# Patient Record
Sex: Male | Born: 2000 | Race: White | Hispanic: No | Marital: Single | Smoking: Never smoker
Health system: Southern US, Community
[De-identification: ages and names within clinical notes are randomized; demographics above are authoritative.]

## PROBLEM LIST (undated history)

## (undated) DIAGNOSIS — T783XXA Angioneurotic edema, initial encounter: Secondary | ICD-10-CM

## (undated) DIAGNOSIS — L309 Dermatitis, unspecified: Secondary | ICD-10-CM

## (undated) HISTORY — DX: Angioneurotic edema, initial encounter: T78.3XXA

## (undated) HISTORY — PX: ADENOIDECTOMY: SUR15

## (undated) HISTORY — PX: TONSILLECTOMY: SUR1361

## (undated) HISTORY — DX: Dermatitis, unspecified: L30.9

## (undated) HISTORY — PX: APPENDECTOMY: SHX54

---
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
## 2021-08-29 ENCOUNTER — Encounter (HOSPITAL_COMMUNITY): Payer: Self-pay

## 2021-08-29 ENCOUNTER — Other Ambulatory Visit: Payer: Self-pay

## 2021-08-29 ENCOUNTER — Ambulatory Visit (HOSPITAL_COMMUNITY)
Admission: EM | Admit: 2021-08-29 | Discharge: 2021-08-29 | Disposition: A | Discharge status: Home / Self Care | Payer: Medicaid Other | Attending: Internal Medicine | Admitting: Internal Medicine

## 2021-08-29 DIAGNOSIS — L219 Seborrheic dermatitis, unspecified: Secondary | ICD-10-CM | POA: Diagnosis not present

## 2021-08-29 MED ORDER — KETOCONAZOLE 2 % EX SHAM: 1.0000 "application " | MEDICATED_SHAMPOO | CUTANEOUS | 0 refills | Status: DC

## 2021-08-29 NOTE — ED Triage Notes (Signed)
Pt c/o small bumps off and on to scalp for a lyear that itches and stings. States had one to lt cheek that he popped and it drained. States his PCP told him to come here for an abx.

## 2021-08-29 NOTE — Discharge Instructions (Addendum)
Please use antifungal shampoo twice a week If symptoms worsen please follow-up with your pediatrician for further evaluation Are no signs of bacterial skin infection.

## 2021-08-29 NOTE — ED Provider Notes (Signed)
[name]    CSN: [id number] Arrival date & time: 08/29/21  1639      History   Chief Complaint No chief complaint on file.   HPI Lonnie West is a 21 y.o. male comes to urgent care with 1 year history of itchy scalp.  Patient says his symptoms have been recurrent in nature over the past year.  Scalp is itchy with intermittent nodular lesion.  He denies any discharge.  He has tried over-the-counter hair shampoo with no improvement in symptoms.  Patient used steroid cream and x-ray with no improvement in symptoms as well.  He was directed to come to the urgent care for further evaluation.  No fever or chills.  No erythema.  Patient has noticed increased desquamation of the scalp skin with no loss of hair.   HPI  Past Medical History:  Diagnosis Date   Angio-edema    Eczema     Patient Active Problem List   Diagnosis Date Noted   Food allergy 10/03/2018   Seasonal and perennial allergic rhinitis 10/03/2018   Allergic conjunctivitis 10/03/2018   Atopic dermatitis 10/03/2018   Left hamstring muscle strain 10/25/2017    Past Surgical History:  Procedure Laterality Date   ADENOIDECTOMY     APPENDECTOMY     TONSILLECTOMY         Home Medications    Prior to Admission medications   Medication Sig Start Date End Date Taking? Authorizing Provider  ketoconazole (NIZORAL) 2 % shampoo Apply 1 application topically 2 (two) times a week. 09/01/21  Yes Ark Agrusa, Myrene Galas, MD  azelastine (ASTELIN) 0.1 % nasal spray Place 2 sprays into both nostrils 2 (two) times daily as needed for rhinitis. 10/03/18   Bobbitt, Sedalia Muta, MD  Crisaborole (EUCRISA) 2 % OINT Apply 1 application topically 2 (two) times daily as needed. 10/03/18   Bobbitt, Sedalia Muta, MD  ibuprofen (ADVIL,MOTRIN) 600 MG tablet Take 1 tablet (600 mg total) by mouth every 6 (six) hours as needed. 10/25/18   Wieters, Hallie C, PA-C  levocetirizine (XYZAL) 5 MG tablet Take 1 tablet (5 mg total) by mouth every  evening. 10/03/18   Bobbitt, Sedalia Muta, MD  triamcinolone ointment (KENALOG) 0.1 % Apply 1 application topically 2 (two) times daily as needed. 10/03/18   Bobbitt, Sedalia Muta, MD    Family History Family History  Problem Relation Age of Onset   Healthy Mother    Healthy Father     Social History Social History   Tobacco Use   Smoking status: Never   Smokeless tobacco: Never  Vaping Use   Vaping Use: Never used  Substance Use Topics   Alcohol use: No   Drug use: No     Allergies   Patient has no known allergies.   Review of Systems Review of Systems  Skin:  Positive for rash. Negative for color change and wound.  Neurological: Negative.  Negative for headaches.    Physical Exam Triage Vital Signs ED Triage Vitals  Enc Vitals Group     BP 08/29/21 1710 105/72     Pulse Rate 08/29/21 1710 80     Resp 08/29/21 1710 18     Temp 08/29/21 1710 97.9 F (36.6 C)     Temp Source 08/29/21 1710 Oral     SpO2 08/29/21 1710 98 %     Weight --      Height --      Head Circumference --      Peak  Flow --      Pain Score 08/29/21 1711 0     Pain Loc --      Pain Edu? --      Excl. in [place]? --    No data found.  Updated Vital Signs BP 105/72 (BP Location: Left Arm)    Pulse 80    Temp 97.9 F (36.6 C) (Oral)    Resp 18    SpO2 98%   Visual Acuity Right Eye Distance:   Left Eye Distance:   Bilateral Distance:    Right Eye Near:   Left Eye Near:    Bilateral Near:     Physical Exam Vitals and nursing note reviewed.  Constitutional:      General: He is not in acute distress.    Appearance: He is not ill-appearing.  Skin:    Comments: Patient has long hair.  Desquamation of scalp skin noted.  No erythema.  No discharge.  No palpable nodules or masses.  Neurological:     Mental Status: He is alert.     UC Treatments / Results  Labs (all labs ordered are listed, but only abnormal results are displayed) Labs Reviewed - No data to  display  EKG   Radiology No results found.  Procedures Procedures (including critical care time)  Medications Ordered in UC Medications - No data to display  Initial Impression / Assessment and Plan / UC Course  I have reviewed the triage vital signs and the nursing notes.  Pertinent labs & imaging results that were available during my care of the patient were reviewed by me and considered in my medical decision making (see chart for details).     Seborrheic dermatitis of the scalp: Ketoconazole shampoo twice weekly If patient has persistent symptoms or no improvement in his symptoms he is advised to follow-up with his PCP for further recommendations. Final Clinical Impressions(s) / UC Diagnoses   Final diagnoses:  Seborrheic dermatitis of scalp     Discharge Instructions      Please use antifungal shampoo twice a week If symptoms worsen please follow-up with your pediatrician for further evaluation Are no signs of bacterial skin infection.   ED Prescriptions     Medication Sig Dispense Auth. Provider   ketoconazole (NIZORAL) 2 % shampoo Apply 1 application topically 2 (two) times a week. 120 mL Sherlon Nied, Myrene Galas, MD      PDMP not reviewed this encounter.   Chase Picket, MD 08/29/21 [phone]

## 2022-03-19 ENCOUNTER — Encounter (HOSPITAL_COMMUNITY): Payer: Self-pay | Admitting: Emergency Medicine

## 2022-03-19 ENCOUNTER — Other Ambulatory Visit: Payer: Self-pay

## 2022-03-19 ENCOUNTER — Ambulatory Visit (HOSPITAL_COMMUNITY)
Admission: EM | Admit: 2022-03-19 | Discharge: 2022-03-19 | Disposition: A | Discharge status: Home / Self Care | Payer: Medicaid Other | Attending: Emergency Medicine | Admitting: Emergency Medicine

## 2022-03-19 DIAGNOSIS — W57XXXA Bitten or stung by nonvenomous insect and other nonvenomous arthropods, initial encounter: Secondary | ICD-10-CM

## 2022-03-19 DIAGNOSIS — S30860A Insect bite (nonvenomous) of lower back and pelvis, initial encounter: Secondary | ICD-10-CM | POA: Diagnosis not present

## 2022-03-19 DIAGNOSIS — D229 Melanocytic nevi, unspecified: Secondary | ICD-10-CM | POA: Diagnosis not present

## 2022-03-19 DIAGNOSIS — L0201 Cutaneous abscess of face: Secondary | ICD-10-CM

## 2022-03-19 MED ORDER — KETOCONAZOLE 2 % EX SHAM: 1.0000 | MEDICATED_SHAMPOO | CUTANEOUS | 0 refills | Status: AC

## 2022-03-19 MED ORDER — DOXYCYCLINE HYCLATE 100 MG PO CAPS: 100.0000 mg | ORAL_CAPSULE | Freq: Two times a day (BID) | ORAL | 0 refills | Status: AC

## 2022-03-19 MED ORDER — TRIAMCINOLONE ACETONIDE 0.1 % EX OINT: 1.0000 | TOPICAL_OINTMENT | Freq: Two times a day (BID) | CUTANEOUS | 3 refills | Status: AC | PRN

## 2022-03-19 NOTE — Discharge Instructions (Addendum)
Take the medication twice daily for 5 days. I recommend to take with food.  I have refilled your Kenalog cream and ketoconazole shampoo.  If you have itching on the insect bite, you can apply hydrocortisone shampoo.  I recommend follow up with primary care regarding the right hand mole, and possible referral to dermatology if needed.

## 2022-03-19 NOTE — ED Provider Notes (Signed)
[place]    CSN: [id number] Arrival date & time: 03/19/22  1411      History   Chief Complaint Chief Complaint  Patient presents with   Cyst    HPI Lonnie West is a 21 y.o. male.  Presents with left jaw abscess.  Reports it had been very swollen, red, tender but has been getting better over the last few days.  History of abscess in the same place about 3 months ago.  Denies any drainage, no fever or chills.  Does not have any sore throat or mouth pain.  No dental pain.  Additionally reports insect bite to the right lower back a few days ago. Has been getting better, not itchy, no drainage.  No pain.  He has a mole on his right dorsal hand.  Reports has had this since birth but feels like it has gotten a little bit bigger recently.  Denies any change in color, symmetry or shape.  He lives in [place] and will be returning home in 3 days.  Does not have a primary care provider there.  Past Medical History:  Diagnosis Date   Angio-edema    Eczema     Patient Active Problem List   Diagnosis Date Noted   Food allergy 10/03/2018   Seasonal and perennial allergic rhinitis 10/03/2018   Allergic conjunctivitis 10/03/2018   Atopic dermatitis 10/03/2018   Left hamstring muscle strain 10/25/2017    Past Surgical History:  Procedure Laterality Date   ADENOIDECTOMY     APPENDECTOMY     TONSILLECTOMY         Home Medications    Prior to Admission medications   Medication Sig Start Date End Date Taking? Authorizing Provider  doxycycline (VIBRAMYCIN) 100 MG capsule Take 1 capsule (100 mg total) by mouth 2 (two) times daily for 5 days. 03/19/22 03/24/22 Yes Devanee Pomplun, Wells Guiles, PA-C  ketoconazole (NIZORAL) 2 % shampoo Apply 1 Application topically 2 (two) times a week. 03/19/22   Anastasya Jewell, Wells Guiles, PA-C  triamcinolone ointment (KENALOG) 0.1 % Apply 1 Application topically 2 (two) times daily as needed. 03/19/22   Jahree Dermody, Wells Guiles PA-C    Family History Family History   Problem Relation Age of Onset   Healthy Mother    Healthy Father     Social History Social History   Tobacco Use   Smoking status: Never   Smokeless tobacco: Never  Vaping Use   Vaping Use: Never used  Substance Use Topics   Alcohol use: No   Drug use: No     Allergies   Patient has no known allergies.   Review of Systems Review of Systems Per HPI  Physical Exam Triage Vital Signs ED Triage Vitals  Enc Vitals Group     BP 03/19/22 1440 103/61     Pulse Rate 03/19/22 1440 69     Resp 03/19/22 1440 16     Temp 03/19/22 1440 98.1 F (36.7 C)     Temp Source 03/19/22 1440 Oral     SpO2 03/19/22 1440 98 %     Weight --      Height --      Head Circumference --      Peak Flow --      Pain Score 03/19/22 1438 0     Pain Loc --      Pain Edu? --      Excl. in [place]? --    No data found.  Updated Vital Signs BP 103/61 (BP Location:  Right Arm)   Pulse 69   Temp 98.1 F (36.7 C) (Oral)   Resp 16   SpO2 98%     Physical Exam Vitals and nursing note reviewed.  Constitutional:      General: He is not in acute distress.    Appearance: Normal appearance.  HENT:     Head:      Comments: 1 cm, fluctuant area with some erythema on the left lower jaw, not warm to touch, not draining    Mouth/Throat:     Mouth: Mucous membranes are moist.     Dentition: Normal dentition. No dental tenderness or dental abscesses.     Pharynx: Oropharynx is clear. Uvula midline. No posterior oropharyngeal erythema.  Eyes:     Conjunctiva/sclera: Conjunctivae normal.     Pupils: Pupils are equal, round, and reactive to light.  Cardiovascular:     Rate and Rhythm: Normal rate and regular rhythm.     Pulses: Normal pulses.     Heart sounds: Normal heart sounds.  Pulmonary:     Effort: Pulmonary effort is normal. No respiratory distress.     Breath sounds: Normal breath sounds. No wheezing.  Abdominal:     General: Bowel sounds are normal.     Tenderness: There is no abdominal  tenderness.  Musculoskeletal:        General: Normal range of motion.     Cervical back: Normal range of motion.  Skin:    Comments: Right dorsal hand with half millimeter sized mole, consistent color and border.  Right low back erythematous area that appears mildly excoriated, not open or draining, not warm to touch  Neurological:     Mental Status: He is alert and oriented to person, place, and time.     UC Treatments / Results  Labs (all labs ordered are listed, but only abnormal results are displayed) Labs Reviewed - No data to display  EKG  Radiology No results found.  Procedures Procedures  Medications Ordered in UC Medications - No data to display  Initial Impression / Assessment and Plan / UC Course  I have reviewed the triage vital signs and the nursing notes.  Pertinent labs & imaging results that were available during my care of the patient were reviewed by me and considered in my medical decision making (see chart for details).  Area on left jaw appears to be fluctuant, slightly tender.  Most likely abscess.  Patient did not want to try I&D today.  We will try Doxy twice daily for 5 days.  He will be traveling back to [place] and I recommend establishing with a primary care there and following up regarding this.  The bite on his back does not appear to be infected.  He has no symptoms and just wanted it to be looked at.  Can try steroid cream to the area if needed.  Mole on the dorsal right hand appears symmetrical, regular border, even in color.  He believes it has changed size and I recommend follow-up with the primary care provider, possibly dermatology if he is worried.  Also have refilled Kenalog cream for eczema, ketoconazole shampoo for dermatitis.  Return precautions discussed. Patient agrees to plan and is discharged in stable condition.  Final Clinical Impressions(s) / UC Diagnoses   Final diagnoses:  Facial abscess  Change in mole  Insect bite  of lower back, initial encounter     Discharge Instructions      Take the medication twice daily for 5  days. I recommend to take with food.  I have refilled your Kenalog cream and ketoconazole shampoo.  If you have itching on the insect bite, you can apply hydrocortisone shampoo.  I recommend follow up with primary care regarding the right hand mole, and possible referral to dermatology if needed.    ED Prescriptions     Medication Sig Dispense Auth. Provider   doxycycline (VIBRAMYCIN) 100 MG capsule Take 1 capsule (100 mg total) by mouth 2 (two) times daily for 5 days. 10 capsule Kaivon Livesey, PA-C   ketoconazole (NIZORAL) 2 % shampoo Apply 1 Application topically 2 (two) times a week. 120 mL Amias Hutchinson, PA-C   triamcinolone ointment (KENALOG) 0.1 % Apply 1 Application topically 2 (two) times daily as needed. 30 g Katerin Negrete, Wells Guiles, PA-C      PDMP not reviewed this encounter.   Kyra Leyland 03/19/22 1537

## 2022-03-19 NOTE — ED Triage Notes (Signed)
Left jaw with a knot.  Last episode was 3 months ago when it popped.  Patient thought the shaver he was using had rust on it.  Reportedly, there is a place on his back, and patient has mole on his right hand that "scabs over"   Patient is living in [place] and visiting home.

## 2024-02-29 ENCOUNTER — Encounter: Payer: Self-pay | Admitting: Emergency Medicine

## 2024-02-29 ENCOUNTER — Ambulatory Visit: Admission: EM | Admit: 2024-02-29 | Discharge: 2024-02-29 | Disposition: A | Discharge status: Home / Self Care

## 2024-02-29 DIAGNOSIS — J069 Acute upper respiratory infection, unspecified: Secondary | ICD-10-CM | POA: Diagnosis not present

## 2024-02-29 LAB — POCT RAPID STREP A (OFFICE): Rapid Strep A Screen: NEGATIVE

## 2024-02-29 MED ORDER — PREDNISONE 10 MG (21) PO TBPK: ORAL_TABLET | Freq: Every day | ORAL | 0 refills | Status: AC

## 2024-02-29 MED ORDER — AZITHROMYCIN 250 MG PO TABS: 250.0000 mg | ORAL_TABLET | Freq: Every day | ORAL | 0 refills | Status: AC

## 2024-02-29 NOTE — Discharge Instructions (Signed)
 Your symptoms today are most likely being caused by a virus and should steadily improve in time it can take up to 7 to 10 days before you truly start to see a turnaround however things will get better  You have had similar symptoms recently and therefore you have been placed on antibiotic to ensure symptoms resolve and did not linger  Take azithromycin  as directed  To help with pain and inflammation take prednisone  every morning with food as directed, stop use of ibuprofen  during treatment but may take Tylenol    For cough: honey 1/2 to 1 teaspoon (you can dilute the honey in water or another fluid).  You can also use guaifenesin and dextromethorphan for cough. You can use a humidifier for chest congestion and cough.  If you don't have a humidifier, you can sit in the bathroom with the hot shower running.      For sore throat: try warm salt water gargles, cepacol lozenges, throat spray, warm tea or water with lemon/honey, popsicles or ice, or OTC cold relief medicine for throat discomfort.   For congestion: take a daily anti-histamine like Zyrtec, Claritin, and a oral decongestant, such as pseudoephedrine.  You can also use Flonase 1-2 sprays in each nostril daily.   It is important to stay hydrated: drink plenty of fluids (water, gatorade/powerade/pedialyte, juices, or teas) to keep your throat moisturized and help further relieve irritation/discomfort.

## 2024-02-29 NOTE — ED Triage Notes (Signed)
 Pt presents c/o sore throat, otalgia and nasal congestion x 4 days. Pt says he had the same sxs 2 weeks ago but they went away. Now they've returned a little bit worse than before.

## 2024-02-29 NOTE — ED Provider Notes (Signed)
 [facility]    CSN: [id number] Arrival date & time: 02/29/24  1539      History   Chief Complaint Chief Complaint  Patient presents with   Nasal Congestion   Sore Throat   Otalgia    HPI Lonnie West is a 23 y.o. male.   Patient presents for evaluation of chills.  Congestion, rhinorrhea, bilateral ear fullness, sore throat, intermittent hoarseness and a nonproductive cough beginning 4 days ago.  Cough has resolved.  Had similar symptoms 2 weeks ago that resolved spontaneously.  Has attempted use of ibuprofen .  Sick contact with similar symptoms approximately 1 month ago.    Past Medical History:  Diagnosis Date   Angio-edema    Eczema     Patient Active Problem List   Diagnosis Date Noted   Weight loss 12/15/2019   Food allergy  10/03/2018   Seasonal and perennial allergic rhinitis 10/03/2018   Allergic conjunctivitis 10/03/2018   Atopic dermatitis 10/03/2018   Seasonal and perennial allergic rhinitis 10/03/2018   Left hamstring muscle strain 10/25/2017    Past Surgical History:  Procedure Laterality Date   ADENOIDECTOMY     APPENDECTOMY     TONSILLECTOMY         Home Medications    Prior to Admission medications   Medication Sig Start Date End Date Taking? Authorizing Provider  Azelastine  HCl 137 MCG/SPRAY SOLN PLACE 2 SPRAYS INTO BOTH NOSTRILS 2 (TWO) TIMES DAILY AS NEEDED FOR RHINITIS. 01/30/19  Yes [provider]  Crisaborole  (EUCRISA ) 2 % OINT Apply topically. 10/03/18  Yes [provider]  Olopatadine HCl 0.7 % SOLN Apply 1 drop to eye. 10/03/18  Yes [provider]  ondansetron (ZOFRAN-ODT) 4 MG disintegrating tablet Take 4 mg by mouth. 04/13/22  Yes [provider]  sodium chloride (ALTAMIST SPRAY) 0.65 % nasal spray Place 2 sprays into the nose. 03/09/19  Yes [provider]  ketoconazole  (NIZORAL ) 2 % shampoo Apply 1 Application topically 2 (two) times a week. 03/19/22   Rising, Asberry, PA-C   Multiple Vitamin (MULTI-VITAMIN) tablet Take 1 tablet by mouth.    [provider]  Multiple Vitamin (ONE DAILY) tablet Take by mouth.    [provider]  triamcinolone  ointment (KENALOG ) 0.1 % Apply 1 Application topically 2 (two) times daily as needed. 03/19/22   Rising, Asberry PA-C    Family History Family History  Problem Relation Age of Onset   Healthy Mother    Healthy Father     Social History Social History   Tobacco Use   Smoking status: Never   Smokeless tobacco: Never  Vaping Use   Vaping status: Never Used  Substance Use Topics   Alcohol use: No   Drug use: No     Allergies   Patient has no known allergies.   Review of Systems Review of Systems  Constitutional:  Positive for chills. Negative for activity change, appetite change, diaphoresis, fatigue, fever and unexpected weight change.  HENT:  Positive for congestion, ear pain, sore throat and voice change. Negative for dental problem, drooling, ear discharge, facial swelling, hearing loss, mouth sores, nosebleeds, postnasal drip, rhinorrhea, sinus pressure, sinus pain, sneezing, tinnitus and trouble swallowing.   Respiratory:  Positive for cough. Negative for apnea, choking, chest tightness, shortness of breath, wheezing and stridor.   Cardiovascular: Negative.   Gastrointestinal: Negative.      Physical Exam Triage Vital Signs ED Triage Vitals  Encounter Vitals Group     BP 02/29/24 1555 ROLLEN)  146/81     Girls Systolic BP Percentile --      Girls Diastolic BP Percentile --      Boys Systolic BP Percentile --      Boys Diastolic BP Percentile --      Pulse Rate 02/29/24 1555 81     Resp 02/29/24 1555 16     Temp 02/29/24 1555 98 F (36.7 C)     Temp Source 02/29/24 1555 Oral     SpO2 02/29/24 1555 97 %     Weight 02/29/24 1554 160 lb (72.6 kg)     Height --      Head Circumference --      Peak Flow --      Pain Score 02/29/24 1554 3     Pain Loc --      Pain Education --       Exclude from Growth Chart --    No data found.  Updated Vital Signs BP (!) 146/81 (BP Location: Left Arm)   Pulse 81   Temp 98 F (36.7 C) (Oral)   Resp 16   Wt 160 lb (72.6 kg)   SpO2 97%   Visual Acuity Right Eye Distance:   Left Eye Distance:   Bilateral Distance:    Right Eye Near:   Left Eye Near:    Bilateral Near:     Physical Exam Constitutional:      Appearance: Normal appearance. He is well-developed.  HENT:     Head: Normocephalic.     Right Ear: Tympanic membrane and ear canal normal.     Left Ear: Tympanic membrane and ear canal normal.     Nose: Congestion present. No rhinorrhea.     Mouth/Throat:     Pharynx: Posterior oropharyngeal erythema present. No oropharyngeal exudate.  Cardiovascular:     Rate and Rhythm: Normal rate and regular rhythm.     Heart sounds: Normal heart sounds.  Pulmonary:     Effort: Pulmonary effort is normal.  Musculoskeletal:     Cervical back: Normal range of motion and neck supple.  Skin:    General: Skin is warm.  Neurological:     General: No focal deficit present.     Mental Status: He is alert and oriented to person, place, and time.      UC Treatments / Results  Labs (all labs ordered are listed, but only abnormal results are displayed) Labs Reviewed  POCT RAPID STREP A (OFFICE) - Normal    EKG   Radiology No results found.  Procedures Procedures (including critical care time)  Medications Ordered in UC Medications - No data to display  Initial Impression / Assessment and Plan / UC Course  I have reviewed the triage vital signs and the nursing notes.  Pertinent labs & imaging results that were available during my care of the patient were reviewed by me and considered in my medical decision making (see chart for details).  Acute URI  Patient is in no signs of distress nor toxic appearing.  Vital signs are stable.  Low suspicion for pneumonia, pneumothorax or bronchitis and therefore will defer  imaging.  Rapid strep test negative.  Etiology most likely viral but recent similar symptoms we will empirically place on azithromycin .  Additionally prescribed prednisone  for comfort.May use additional over-the-counter medications as needed for supportive care.  May follow-up with urgent care as needed if symptoms persist or worsen.  Final Clinical Impressions(s) / UC Diagnoses   Final diagnoses:  None  Discharge Instructions   None    ED Prescriptions   None    PDMP not reviewed this encounter.   Teresa Shelba [place], [place] 02/29/24 [phone]
